# Patient Record
Sex: Female | Born: 1940 | Race: White | Hispanic: No | Marital: Married
Health system: Southern US, Community
[De-identification: ages and names within clinical notes are randomized; demographics above are authoritative.]

## PROBLEM LIST (undated history)

## (undated) DIAGNOSIS — G629 Polyneuropathy, unspecified: Secondary | ICD-10-CM

## (undated) DIAGNOSIS — E119 Type 2 diabetes mellitus without complications: Secondary | ICD-10-CM

## (undated) DIAGNOSIS — N289 Disorder of kidney and ureter, unspecified: Secondary | ICD-10-CM

## (undated) DIAGNOSIS — J939 Pneumothorax, unspecified: Secondary | ICD-10-CM

## (undated) HISTORY — PX: AORTA SURGERY: SHX548

## (undated) HISTORY — PX: ABDOMINAL HYSTERECTOMY: SHX81

## (undated) HISTORY — PX: CHOLECYSTECTOMY: SHX55

## (undated) HISTORY — PX: HIP OPEN REDUCTION: SHX1755

---

## 2021-01-18 ENCOUNTER — Emergency Department (HOSPITAL_BASED_OUTPATIENT_CLINIC_OR_DEPARTMENT_OTHER)
Admission: EM | Admit: 2021-01-18 | Discharge: 2021-01-19 | Disposition: A | Discharge status: Home / Self Care | Payer: Medicare Other | Attending: Emergency Medicine | Admitting: Emergency Medicine

## 2021-01-18 ENCOUNTER — Other Ambulatory Visit: Payer: Self-pay

## 2021-01-18 ENCOUNTER — Emergency Department (HOSPITAL_BASED_OUTPATIENT_CLINIC_OR_DEPARTMENT_OTHER): Payer: Medicare Other

## 2021-01-18 DIAGNOSIS — R1084 Generalized abdominal pain: Secondary | ICD-10-CM | POA: Insufficient documentation

## 2021-01-18 DIAGNOSIS — R300 Dysuria: Secondary | ICD-10-CM | POA: Diagnosis present

## 2021-01-18 DIAGNOSIS — I77811 Abdominal aortic ectasia: Secondary | ICD-10-CM | POA: Diagnosis not present

## 2021-01-18 DIAGNOSIS — I700 Atherosclerosis of aorta: Secondary | ICD-10-CM

## 2021-01-18 LAB — COMPREHENSIVE METABOLIC PANEL
ALT: 20 U/L (ref 0–44)
AST: 30 U/L (ref 15–41)
Albumin: 3.6 g/dL (ref 3.5–5.0)
Alkaline Phosphatase: 69 U/L (ref 38–126)
Anion gap: 12 (ref 5–15)
BUN: 30 mg/dL — ABNORMAL HIGH (ref 8–23)
CO2: 24 mmol/L (ref 22–32)
Calcium: 9 mg/dL (ref 8.9–10.3)
Chloride: 92 mmol/L — ABNORMAL LOW (ref 98–111)
Creatinine, Ser: 1.43 mg/dL — ABNORMAL HIGH (ref 0.44–1.00)
GFR, Estimated: 37 mL/min — ABNORMAL LOW (ref >60)
Glucose, Bld: 245 mg/dL — ABNORMAL HIGH (ref 70–99)
Potassium: 3.9 mmol/L (ref 3.5–5.1)
Sodium: 128 mmol/L — ABNORMAL LOW (ref 135–145)
Total Bilirubin: 0.6 mg/dL (ref 0.3–1.2)
Total Protein: 7.4 g/dL (ref 6.5–8.1)

## 2021-01-18 LAB — URINALYSIS, MICROSCOPIC (REFLEX)

## 2021-01-18 LAB — CBC WITH DIFFERENTIAL/PLATELET
Abs Immature Granulocytes: 0.07 10*3/uL (ref 0.00–0.07)
Basophils Absolute: 0 10*3/uL (ref 0.0–0.1)
Basophils Relative: 0 %
Eosinophils Absolute: 0 10*3/uL (ref 0.0–0.5)
Eosinophils Relative: 0 %
HCT: 34.9 % — ABNORMAL LOW (ref 36.0–46.0)
Hemoglobin: 11.6 g/dL — ABNORMAL LOW (ref 12.0–15.0)
Immature Granulocytes: 0 %
Lymphocytes Relative: 9 %
Lymphs Abs: 1.5 10*3/uL (ref 0.7–4.0)
MCH: 30.5 pg (ref 26.0–34.0)
MCHC: 33.2 g/dL (ref 30.0–36.0)
MCV: 91.8 fL (ref 80.0–100.0)
Monocytes Absolute: 0.5 10*3/uL (ref 0.1–1.0)
Monocytes Relative: 3 %
Neutro Abs: 14.4 10*3/uL — ABNORMAL HIGH (ref 1.7–7.7)
Neutrophils Relative %: 88 %
Platelets: 336 10*3/uL (ref 150–400)
RBC: 3.8 MIL/uL — ABNORMAL LOW (ref 3.87–5.11)
RDW: 14.6 % (ref 11.5–15.5)
WBC: 16.5 10*3/uL — ABNORMAL HIGH (ref 4.0–10.5)
nRBC: 0 % (ref 0.0–0.2)

## 2021-01-18 LAB — URINALYSIS, ROUTINE W REFLEX MICROSCOPIC
Bilirubin Urine: NEGATIVE
Glucose, UA: 100 mg/dL — AB
Hgb urine dipstick: NEGATIVE
Ketones, ur: NEGATIVE mg/dL
Leukocytes,Ua: NEGATIVE
Nitrite: NEGATIVE
Protein, ur: 100 mg/dL — AB
Specific Gravity, Urine: 1.02 (ref 1.005–1.030)
pH: 5.5 (ref 5.0–8.0)

## 2021-01-18 LAB — LIPASE, BLOOD: Lipase: 22 U/L (ref 11–51)

## 2021-01-18 MED ORDER — FENTANYL CITRATE (PF) 100 MCG/2ML IJ SOLN
25.0000 ug | Freq: Once | INTRAMUSCULAR | Status: AC
  Administered 2021-01-18: 25 ug via INTRAVENOUS
  Filled 2021-01-18: qty 2

## 2021-01-18 MED ORDER — SODIUM CHLORIDE 0.9 % IV BOLUS
500.0000 mL | Freq: Once | INTRAVENOUS | Status: AC
  Administered 2021-01-18: 500 mL via INTRAVENOUS

## 2021-01-18 MED ORDER — IOHEXOL 350 MG/ML SOLN
80.0000 mL | Freq: Once | INTRAVENOUS | Status: AC | PRN
  Administered 2021-01-18: 80 mL via INTRAVENOUS

## 2021-01-18 NOTE — ED Notes (Signed)
Patient transported to CT 

## 2021-01-18 NOTE — ED Provider Notes (Signed)
[facility] EMERGENCY DEPARTMENT Provider Note   CSN: [id number] Arrival date & time: 01/18/21  1946     History Chief Complaint  Patient presents with   Dysuria   Abdominal Pain    Tamara Craig is a 80 y.o. female.  The history is provided by the patient, a relative and medical records.  Dysuria Associated symptoms: abdominal pain   Abdominal Pain Associated symptoms: dysuria   Tamara Craig is a 80 y.o. female who presents to the Emergency Department complaining of possible UTI.  She presents to the ED accompanied by her daughter for evaluation for possible UTI.  She was treated for a UTI one month ago and was treated with a round of amoxicillin (for abdominal pain - possible diverticulitis as well as UTI).  She completed the antibiotics around 6/2 and was rechecked and found to be free of the UTI.  She has been complaining of lower abdominal pain off and on.  Pain is worse with meals over the last thirty days.  No dysuria.  No fever, vomiting.  Occasional nausea.  Has diarrhea - 2-3 times daily.      No past medical history on file. DM, HTN, HPL  There are no problems to display for this patient.      OB History   No obstetric history on file.     No family history on file.     Home Medications Prior to Admission medications   Not on File    Allergies    Morphine and related  Review of Systems   Review of Systems  Gastrointestinal:  Positive for abdominal pain.  Genitourinary:  Positive for dysuria.  All other systems reviewed and are negative.  Physical Exam Updated Vital Signs BP (!) 163/117   Pulse 100   Temp 98 F (36.7 C) (Oral)   Resp 16   Ht 5\' 2"  (1.575 m)   Wt 65.8 kg   SpO2 96%   BMI 26.52 kg/m   Physical Exam Vitals and nursing note reviewed.  Constitutional:      Appearance: She is well-developed.     Comments: Uncomfortable appearing  HENT:     Head: Normocephalic and atraumatic.  Cardiovascular:     Rate and  Rhythm: Normal rate and regular rhythm.     Heart sounds: No murmur heard. Pulmonary:     Effort: Pulmonary effort is normal. No respiratory distress.     Breath sounds: Normal breath sounds.  Abdominal:     Palpations: Abdomen is soft.     Tenderness: There is no guarding or rebound.     Comments: Mild generalized abdominal tenderness  Musculoskeletal:        General: No tenderness.  Skin:    General: Skin is warm and dry.  Neurological:     Mental Status: She is alert and oriented to person, place, and time.  Psychiatric:        Behavior: Behavior normal.    ED Results / Procedures / Treatments   Labs (all labs ordered are listed, but only abnormal results are displayed) Labs Reviewed  URINALYSIS, ROUTINE W REFLEX MICROSCOPIC - Abnormal; Notable for the following components:      Result Value   APPearance HAZY (*)    Glucose, UA 100 (*)    Protein, ur 100 (*)    All other components within normal limits  COMPREHENSIVE METABOLIC PANEL - Abnormal; Notable for the following components:   Sodium 128 (*)    Chloride 92 (*)  Glucose, Bld 245 (*)    BUN 30 (*)    Creatinine, Ser 1.43 (*)    GFR, Estimated 37 (*)    All other components within normal limits  CBC WITH DIFFERENTIAL/PLATELET - Abnormal; Notable for the following components:   WBC 16.5 (*)    RBC 3.80 (*)    Hemoglobin 11.6 (*)    HCT 34.9 (*)    Neutro Abs 14.4 (*)    All other components within normal limits  URINALYSIS, MICROSCOPIC (REFLEX) - Abnormal; Notable for the following components:   Bacteria, UA FEW (*)    All other components within normal limits  URINE CULTURE  LIPASE, BLOOD    EKG None  Radiology CT Abdomen Pelvis Wo Contrast  Result Date: 01/18/2021 CLINICAL DATA:  Unspecified abdominal pain EXAM: CT ABDOMEN AND PELVIS WITHOUT CONTRAST TECHNIQUE: Multidetector CT imaging of the abdomen and pelvis was performed following the standard protocol without IV contrast. COMPARISON:  12/15/2020  FINDINGS: Lower chest: Mild bibasilar pulmonary fibrosis. Multiple healed right rib fractures are noted. Extensive multi-vessel coronary artery calcification. Global cardiac size within normal limits. Hepatobiliary: No focal liver abnormality is seen. Status post cholecystectomy. No biliary dilatation. Pancreas: Unremarkable Spleen: Unremarkable Adrenals/Urinary Tract: The adrenal glands are unremarkable. The kidneys are normal in size and position. Mild right renal cortical scarring noted. Tiny hyperdense focus within the upper pole of the left kidney possibly representing a tiny cortical calcification or tiny hyperdense cyst is unchanged. No hydronephrosis. No intrarenal or ureteral calculi. No perinephric fluid collections are seen. The bladder is unremarkable. Stomach/Bowel: The stomach, small bowel, and large bowel are unremarkable save for a few scattered diverticula within the sigmoid colon. Appendix is normal. No free intraperitoneal gas or fluid. Vascular/Lymphatic: Extensive aortoiliac atherosclerotic calcification is again identified with particularly prominent atherosclerotic calcification seen within the proximal superior mesenteric artery and inferior mesenteric artery as well as the a proximal renal arteries bilaterally. Moderate atherosclerotic calcification is also noted within the proximal celiac axis. No aortic aneurysm. No pathologic adenopathy within the abdomen and pelvis. Reproductive: Status post hysterectomy. No adnexal masses. Other: No abdominal wall hernia.  Rectum unremarkable. Musculoskeletal: Healed fractures of the right superior and inferior pubic rami are identified. Degenerative changes are seen within the lumbar spine. No acute bone abnormality. IMPRESSION: No acute intra-abdominal pathology identified. No definite radiographic explanation for the patient's reported symptoms. Peripheral vascular disease with extensive atherosclerotic calcification at the origin of the mesenteric  and renal vasculature. If there is clinical evidence of chronic mesenteric ischemia or hemodynamically significant renal artery stenosis, this would be better assessed with CT or formal arteriography. Electronically Signed   By: Helyn Numbers MD   On: 01/18/2021 22:13   CT Angio Abd/Pel W and/or Wo Contrast  Result Date: 01/18/2021 CLINICAL DATA:  Mesenteric ischemia EXAM: CTA ABDOMEN AND PELVIS WITHOUT AND WITH CONTRAST TECHNIQUE: Multidetector CT imaging of the abdomen and pelvis was performed using the standard protocol during bolus administration of intravenous contrast. Multiplanar reconstructed images and MIPs were obtained and reviewed to evaluate the vascular anatomy. CONTRAST:  48mL OMNIPAQUE IOHEXOL 350 MG/ML SOLN COMPARISON:  Noncontrast CT earlier today FINDINGS: VASCULAR Aorta: Heavily calcified aorta.  No aneurysm.  No dissection. Celiac: Calcifications at the origin and within the proximal celiac artery. No significant stenosis. SMA: Heavily calcified origin and proximal vessel. Tight stenosis within the proximal SMA. Renals: Heavily calcified renal arteries proximally, left greater than right. Probable moderate stenosis within the proximal left renal artery. IMA: Heavily  calcified origin. Vessel is patent. Probable stenosis at the origin. Inflow: Heavily calcified common iliac arteries. No significant stenosis. Proximal Outflow: Calcified common femoral arteries bilaterally. No significant stenosis. Veins: No obvious venous abnormality within the limitations of this arterial phase study. Review of the MIP images confirms the above findings. NON-VASCULAR Lower chest: Scarring in the lung bases.  No acute abnormality. Hepatobiliary: No focal liver abnormality is seen. Status post cholecystectomy. No biliary dilatation. Pancreas: No focal abnormality or ductal dilatation. Spleen: No focal abnormality.  Normal size. Adrenals/Urinary Tract: No focal renal or adrenal mass. Areas of scarring and  cortical thinning in the upper pole of the right kidney. No hydronephrosis. Urinary bladder unremarkable. Stomach/Bowel: No areas of wall thickening to suggest ischemia or enteritis/colitis. No bowel obstruction. Grossly unremarkable. Lymphatic: No adenopathy Reproductive: Prior hysterectomy.  No adnexal masses. Other: No free fluid or free air. Musculoskeletal: Old healed right superior and inferior pubic rami fractures. Degenerative changes in the lumbar spine. No acute bony abnormality. IMPRESSION: VASCULAR Heavily calcified aorta, iliac vessels and branch vessels. Tight stenosis within the proximal left superior mesenteric artery. Probable mild-to-moderate stenosis within the proximal left renal artery. Mild narrowing at the origin of the IMA. NON-VASCULAR No visible changes within the bowel to suggest ischemia. Electronically Signed   By: Charlett Nose M.D.   On: 01/18/2021 22:59    Procedures Procedures   Medications Ordered in ED Medications  sodium chloride 0.9 % bolus 500 mL (0 mLs Intravenous Stopped 01/18/21 2208)  fentaNYL (SUBLIMAZE) injection 25 mcg (25 mcg Intravenous Given 01/18/21 2048)  sodium chloride 0.9 % bolus 500 mL (500 mLs Intravenous New Bag/Given 01/18/21 2209)  iohexol (OMNIPAQUE) 350 MG/ML injection 80 mL (80 mLs Intravenous Contrast Given 01/18/21 2232)    ED Course  I have reviewed the triage vital signs and the nursing notes.  Pertinent labs & imaging results that were available during my care of the patient were reviewed by me and considered in my medical decision making (see chart for details).    MDM Rules/Calculators/A&P                         patient here for evaluation of intermittent abdominal pain over the last month, complaining of dysuria. She has mild tenderness on examination without peritoneal findings. CBC with leukocytosis, records reviewed in care everywhere. Leukocytosis is similar but slightly higher when compared to prior. BMP with creatinine at her  baseline, mild decrease in sodium compared to priors. She was treated with IV fluid hydration. Presentation is not consistent with DKA. CT scan is negative for acute ischemia but does demonstrate stenosis of the abdominal vasculature. Discussed with daughter importance of outpatient follow-up as well as return precautions.  Final Clinical Impression(s) / ED Diagnoses Final diagnoses:  Generalized abdominal pain  Atherosclerosis of abdominal aorta [facility])    Rx / DC Orders ED Discharge Orders     None        Tilden Fossa, MD 01/19/21 [id number]

## 2021-01-18 NOTE — ED Triage Notes (Signed)
Pt c/o "UTI"-daughter states pt c/o abd pain x today-states pt completed abx for UTI ~2 weeks ago-pt grunting in triage-to triage in w/c

## 2021-01-19 DIAGNOSIS — I77811 Abdominal aortic ectasia: Secondary | ICD-10-CM | POA: Diagnosis not present

## 2021-01-20 LAB — URINE CULTURE

## 2021-04-28 ENCOUNTER — Emergency Department (HOSPITAL_BASED_OUTPATIENT_CLINIC_OR_DEPARTMENT_OTHER): Payer: Medicare Other

## 2021-04-28 ENCOUNTER — Emergency Department (HOSPITAL_BASED_OUTPATIENT_CLINIC_OR_DEPARTMENT_OTHER)
Admission: EM | Admit: 2021-04-28 | Discharge: 2021-04-28 | Disposition: A | Discharge status: Home / Self Care | Payer: Medicare Other | Attending: Emergency Medicine | Admitting: Emergency Medicine

## 2021-04-28 ENCOUNTER — Encounter (HOSPITAL_BASED_OUTPATIENT_CLINIC_OR_DEPARTMENT_OTHER): Payer: Self-pay | Admitting: *Deleted

## 2021-04-28 ENCOUNTER — Other Ambulatory Visit: Payer: Self-pay

## 2021-04-28 DIAGNOSIS — R410 Disorientation, unspecified: Secondary | ICD-10-CM | POA: Insufficient documentation

## 2021-04-28 DIAGNOSIS — E1165 Type 2 diabetes mellitus with hyperglycemia: Secondary | ICD-10-CM | POA: Insufficient documentation

## 2021-04-28 DIAGNOSIS — R739 Hyperglycemia, unspecified: Secondary | ICD-10-CM | POA: Diagnosis present

## 2021-04-28 HISTORY — DX: Type 2 diabetes mellitus without complications: E11.9

## 2021-04-28 HISTORY — DX: Polyneuropathy, unspecified: G62.9

## 2021-04-28 HISTORY — DX: Disorder of kidney and ureter, unspecified: N28.9

## 2021-04-28 HISTORY — DX: Pneumothorax, unspecified: J93.9

## 2021-04-28 LAB — CBG MONITORING, ED: Glucose-Capillary: 248 mg/dL — ABNORMAL HIGH (ref 70–99)

## 2021-04-28 LAB — CBC WITH DIFFERENTIAL/PLATELET
Abs Immature Granulocytes: 0.06 10*3/uL (ref 0.00–0.07)
Basophils Absolute: 0 10*3/uL (ref 0.0–0.1)
Basophils Relative: 0 %
Eosinophils Absolute: 0 10*3/uL (ref 0.0–0.5)
Eosinophils Relative: 0 %
HCT: 33.5 % — ABNORMAL LOW (ref 36.0–46.0)
Hemoglobin: 11 g/dL — ABNORMAL LOW (ref 12.0–15.0)
Immature Granulocytes: 1 %
Lymphocytes Relative: 14 %
Lymphs Abs: 1.6 10*3/uL (ref 0.7–4.0)
MCH: 31.3 pg (ref 26.0–34.0)
MCHC: 32.8 g/dL (ref 30.0–36.0)
MCV: 95.4 fL (ref 80.0–100.0)
Monocytes Absolute: 0.6 10*3/uL (ref 0.1–1.0)
Monocytes Relative: 6 %
Neutro Abs: 9.2 10*3/uL — ABNORMAL HIGH (ref 1.7–7.7)
Neutrophils Relative %: 79 %
Platelets: 386 10*3/uL (ref 150–400)
RBC: 3.51 MIL/uL — ABNORMAL LOW (ref 3.87–5.11)
RDW: 14.1 % (ref 11.5–15.5)
WBC: 11.5 10*3/uL — ABNORMAL HIGH (ref 4.0–10.5)
nRBC: 0 % (ref 0.0–0.2)

## 2021-04-28 LAB — COMPREHENSIVE METABOLIC PANEL
ALT: 16 U/L (ref 0–44)
AST: 26 U/L (ref 15–41)
Albumin: 3.4 g/dL — ABNORMAL LOW (ref 3.5–5.0)
Alkaline Phosphatase: 101 U/L (ref 38–126)
Anion gap: 10 (ref 5–15)
BUN: 24 mg/dL — ABNORMAL HIGH (ref 8–23)
CO2: 27 mmol/L (ref 22–32)
Calcium: 8.8 mg/dL — ABNORMAL LOW (ref 8.9–10.3)
Chloride: 93 mmol/L — ABNORMAL LOW (ref 98–111)
Creatinine, Ser: 1.51 mg/dL — ABNORMAL HIGH (ref 0.44–1.00)
GFR, Estimated: 35 mL/min — ABNORMAL LOW (ref >60)
Glucose, Bld: 354 mg/dL — ABNORMAL HIGH (ref 70–99)
Potassium: 4.4 mmol/L (ref 3.5–5.1)
Sodium: 130 mmol/L — ABNORMAL LOW (ref 135–145)
Total Bilirubin: 0.4 mg/dL (ref 0.3–1.2)
Total Protein: 7.4 g/dL (ref 6.5–8.1)

## 2021-04-28 LAB — TROPONIN I (HIGH SENSITIVITY)
Troponin I (High Sensitivity): 32 ng/L — ABNORMAL HIGH (ref <18)
Troponin I (High Sensitivity): 32 ng/L — ABNORMAL HIGH (ref <18)

## 2021-04-28 MED ORDER — LACTATED RINGERS IV BOLUS
1000.0000 mL | Freq: Once | INTRAVENOUS | Status: AC
  Administered 2021-04-28: 1000 mL via INTRAVENOUS

## 2021-04-28 NOTE — ED Triage Notes (Signed)
Brought in by daughter for abnormal labs increased trop. 51, increased CBG and cont UTI

## 2021-04-28 NOTE — ED Provider Notes (Signed)
[facility] EMERGENCY DEPARTMENT Provider Note   CSN: [id number] Arrival date & time: 04/28/21  1823     History Chief Complaint  Patient presents with   abnormal labs    Tamara Craig is a 80 y.o. female.  Patient is an 80 year old female with a history of memory issues, diabetes, kidney disease who is presenting today with her daughter due to persistent confusion and elevated blood sugar.  Daughter reports that 2 weeks ago she was seen at urgent care and at that time she was having confusion and hyperglycemia.  They initially did a work-up that showed hyperglycemia but her urine later came back positive for E. coli that was pansensitive and she was started on Vantin 100 mg daily.  She took a 10-day course of that and finished it this past Monday.  However daughter reports that she did not really get any better throughout the course of her symptoms.  Her doctor has been continuing to adjust her Lantus because he had taken her off her insulin because she was refusing to eat and complaining of stomach pain.  She has started eating and drinking more but her blood sugar even in the morning has remained in the 2 and 300s.  She has not complained of any cough, chest pain, shortness of breath.  She has not had fever, dysuria or diarrhea.  She has had no nausea or vomiting.  They spoke with their doctor today and she was seen at urgent care 2 days ago due to ongoing symptoms.  At that time she had a chest x-ray which was negative, labs that showed hyperglycemia and a troponin of 51 without old to compare.  She was then started on Augmentin due to concern for persistent UTI.  When daughter called her doctor today and reported ongoing elevated blood sugar he wanted her to come and be evaluated the emergency room before any further care was modified.  The history is provided by the patient, a relative and medical records.      Past Medical History:  Diagnosis Date   DM (diabetes mellitus) (HCC)     Kidney disease    Neuropathy    Pneumothorax     There are no problems to display for this patient.   Past Surgical History:  Procedure Laterality Date   ABDOMINAL HYSTERECTOMY     AORTA SURGERY     CHOLECYSTECTOMY     HIP OPEN REDUCTION       OB History   No obstetric history on file.     No family history on file.  Social History   Substance Use Topics   Alcohol use: Not Currently   Drug use: Not Currently    Home Medications Prior to Admission medications   Not on File    Allergies    Gabapentin, Morphine and related, and Oxybutynin  Review of Systems   Review of Systems  All other systems reviewed and are negative.  Physical Exam Updated Vital Signs BP (!) 150/74   Pulse 89   Resp 19   Ht 5\' 2"  (1.575 m)   Wt 56.7 kg   SpO2 94%   BMI 22.86 kg/m   Physical Exam Vitals and nursing note reviewed.  Constitutional:      General: She is not in acute distress.    Appearance: Normal appearance. She is well-developed.  HENT:     Head: Normocephalic and atraumatic.  Eyes:     General: No visual field deficit.  Pupils: Pupils are equal, round, and reactive to light.  Cardiovascular:     Rate and Rhythm: Normal rate and regular rhythm.     Pulses: Normal pulses.     Heart sounds: Normal heart sounds. No murmur heard.   No friction rub.  Pulmonary:     Effort: Pulmonary effort is normal.     Breath sounds: Normal breath sounds. No wheezing or rales.  Chest:     Chest wall: No tenderness.  Abdominal:     General: Bowel sounds are normal. There is no distension.     Palpations: Abdomen is soft.     Tenderness: There is no abdominal tenderness. There is no guarding or rebound.  Musculoskeletal:        General: No tenderness. Normal range of motion.     Cervical back: Normal range of motion and neck supple.     Right lower leg: No edema.     Left lower leg: No edema.     Comments: No edema  Skin:    General: Skin is warm and dry.      Findings: No rash.  Neurological:     Mental Status: She is alert and oriented to person, place, and time.     Cranial Nerves: No cranial nerve deficit or facial asymmetry.     Sensory: Sensation is intact. No sensory deficit.     Motor: Motor function is intact. No weakness or pronator drift.     Coordination: Coordination is intact.     Gait: Gait is intact. Gait normal.     Comments: Intermittent word finding difficulty but when she slows down she is able to say the words.  She can follow commands without any difficulty.  Oriented to person and place but not to date.  Psychiatric:        Behavior: Behavior normal.    ED Results / Procedures / Treatments   Labs (all labs ordered are listed, but only abnormal results are displayed) Labs Reviewed  CBC WITH DIFFERENTIAL/PLATELET - Abnormal; Notable for the following components:      Result Value   WBC 11.5 (*)    RBC 3.51 (*)    Hemoglobin 11.0 (*)    HCT 33.5 (*)    Neutro Abs 9.2 (*)    All other components within normal limits  COMPREHENSIVE METABOLIC PANEL - Abnormal; Notable for the following components:   Sodium 130 (*)    Chloride 93 (*)    Glucose, Bld 354 (*)    BUN 24 (*)    Creatinine, Ser 1.51 (*)    Calcium 8.8 (*)    Albumin 3.4 (*)    GFR, Estimated 35 (*)    All other components within normal limits  CBG MONITORING, ED - Abnormal; Notable for the following components:   Glucose-Capillary 248 (*)    All other components within normal limits  TROPONIN I (HIGH SENSITIVITY) - Abnormal; Notable for the following components:   Troponin I (High Sensitivity) 32 (*)    All other components within normal limits  TROPONIN I (HIGH SENSITIVITY) - Abnormal; Notable for the following components:   Troponin I (High Sensitivity) 32 (*)    All other components within normal limits    EKG EKG Interpretation  Date/Time:  Friday April 28 2021 18:51:48 EDT Ventricular Rate:  88 PR Interval:  164 QRS Duration: 118 QT  Interval:  384 QTC Calculation: 465 R Axis:   -63 Text Interpretation: Ectopic atrial rhythm Incomplete RBBB and  LAFB Left ventricular hypertrophy Anterior Q waves, possibly due to LVH ST elevation, consider inferior injury No previous tracing Confirmed by Gwyneth Sprout ([id number]) on 04/28/2021 9:02:18 PM  Radiology CT Head Wo Contrast  Result Date: 04/28/2021 CLINICAL DATA:  Altered level of consciousness, urinary tract infection EXAM: CT HEAD WITHOUT CONTRAST TECHNIQUE: Contiguous axial images were obtained from the base of the skull through the vertex without intravenous contrast. COMPARISON:  None. FINDINGS: Brain: Confluent hypodensities throughout the periventricular white matter consistent with chronic small vessel ischemic change. 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 No signs of acute infarct or hemorrhage. Lateral ventricles and midline structures are unremarkable. No acute extra-axial fluid collections. No mass effect. Diffuse cerebral atrophy. Vascular: Extensive atherosclerosis.  No hyperdense vessel. Skull: Normal. Negative for fracture or focal lesion. Sinuses/Orbits: No acute finding. Other: None. IMPRESSION: 1. Chronic ischemic changes as above. No acute intracranial process. Electronically Signed   By: Sharlet Salina M.D.   On: 04/28/2021 20:44    Procedures Procedures   Medications Ordered in ED Medications  lactated ringers bolus 1,000 mL (0 mLs Intravenous Stopped 04/28/21 2230)    ED Course  I have reviewed the triage vital signs and the nursing notes.  Pertinent labs & imaging results that were available during my care of the patient were reviewed by me and considered in my medical decision making (see chart for details).    MDM Rules/Calculators/A&P                           Patient is an elderly female with multiple medical problems presenting today with ongoing hyperglycemia and intermittent confusion.  This has been ongoing  for the last 2 weeks.  She did have changes in her insulin which is most likely the cause of her hyperglycemia.  However in addition she had a culture positive E. coli UTI and had taken 10 days of Vantin but because symptoms were not improved she was started on Augmentin 2 days ago.  She has not had any nausea or vomiting or reason for dehydration.  She continues to have elevated blood sugars in the 300s here with no change in her baseline renal function.  Her white blood cell count is improving is now 11 from 13.  She had a troponin done of uncertain significance when she was seen at urgent care 2 days ago.  Her EKG does show changes of LVH and incomplete right bundle branch block but based on read from outside hospital seems to be similar.  Troponin x2 today is 32 which is lower than 51 2 days ago and patient denies any chest pain or shortness of breath consistent with an acute cardiac cause. Head CT negative for evidence of stroke as patient's symptoms have been present for 2 weeks feel that there would be an abnormality on CT.  Most likely mild delirium from antibiotics and hyperglycemia.  Patient appears stable for discharge home.  Needs to continue to follow-up with her PCP for blood sugar control.  After 1 L bolus sugar is better in the 200s.  She will finish her dose of antibiotics.  She was given return precautions.  MDM   Amount and/or Complexity of Data Reviewed Clinical lab tests: ordered and reviewed Tests in the radiology section of CPT: ordered and reviewed Tests in the medicine section of CPT: ordered and reviewed Independent visualization of images, tracings, or specimens: yes    Final Clinical Impression(s) /  ED Diagnoses Final diagnoses:  Hyperglycemia    Rx / DC Orders ED Discharge Orders     None        Gwyneth Sprout, MD 04/28/21 2259

## 2021-04-28 NOTE — Discharge Instructions (Signed)
Continue to follow diabetic diet and drink plenty of liquids.  Finish the antibiotic.  Everything with your heart looks okay today.  Your kidneys are functioning at the same as usual.  If you start having high fever, vomiting, inability to walk or other concerns please return to the emergency room.

## 2021-07-15 ENCOUNTER — Encounter (HOSPITAL_BASED_OUTPATIENT_CLINIC_OR_DEPARTMENT_OTHER): Payer: Self-pay | Admitting: *Deleted

## 2021-07-15 ENCOUNTER — Emergency Department (HOSPITAL_BASED_OUTPATIENT_CLINIC_OR_DEPARTMENT_OTHER): Payer: Medicare Other

## 2021-07-15 ENCOUNTER — Other Ambulatory Visit: Payer: Self-pay

## 2021-07-15 ENCOUNTER — Emergency Department (HOSPITAL_BASED_OUTPATIENT_CLINIC_OR_DEPARTMENT_OTHER)
Admission: EM | Admit: 2021-07-15 | Discharge: 2021-07-16 | Disposition: A | Discharge status: Home / Self Care | Payer: Medicare Other | Attending: Emergency Medicine | Admitting: Emergency Medicine

## 2021-07-15 DIAGNOSIS — M25512 Pain in left shoulder: Secondary | ICD-10-CM | POA: Diagnosis not present

## 2021-07-15 DIAGNOSIS — E1140 Type 2 diabetes mellitus with diabetic neuropathy, unspecified: Secondary | ICD-10-CM | POA: Insufficient documentation

## 2021-07-15 DIAGNOSIS — S199XXA Unspecified injury of neck, initial encounter: Secondary | ICD-10-CM | POA: Insufficient documentation

## 2021-07-15 DIAGNOSIS — R8279 Other abnormal findings on microbiological examination of urine: Secondary | ICD-10-CM | POA: Insufficient documentation

## 2021-07-15 DIAGNOSIS — F02818 Dementia in other diseases classified elsewhere, unspecified severity, with other behavioral disturbance: Secondary | ICD-10-CM | POA: Insufficient documentation

## 2021-07-15 DIAGNOSIS — D72829 Elevated white blood cell count, unspecified: Secondary | ICD-10-CM | POA: Insufficient documentation

## 2021-07-15 DIAGNOSIS — U071 COVID-19: Secondary | ICD-10-CM | POA: Insufficient documentation

## 2021-07-15 DIAGNOSIS — G309 Alzheimer's disease, unspecified: Secondary | ICD-10-CM | POA: Insufficient documentation

## 2021-07-15 DIAGNOSIS — W19XXXA Unspecified fall, initial encounter: Secondary | ICD-10-CM | POA: Insufficient documentation

## 2021-07-15 DIAGNOSIS — N390 Urinary tract infection, site not specified: Secondary | ICD-10-CM

## 2021-07-15 DIAGNOSIS — S0990XA Unspecified injury of head, initial encounter: Secondary | ICD-10-CM | POA: Insufficient documentation

## 2021-07-15 DIAGNOSIS — R109 Unspecified abdominal pain: Secondary | ICD-10-CM | POA: Diagnosis not present

## 2021-07-15 LAB — RESP PANEL BY RT-PCR (FLU A&B, COVID) ARPGX2
Influenza A by PCR: NEGATIVE
Influenza B by PCR: NEGATIVE
SARS Coronavirus 2 by RT PCR: POSITIVE — AB

## 2021-07-15 LAB — BASIC METABOLIC PANEL
Anion gap: 11 (ref 5–15)
BUN: 49 mg/dL — ABNORMAL HIGH (ref 8–23)
CO2: 20 mmol/L — ABNORMAL LOW (ref 22–32)
Calcium: 8.8 mg/dL — ABNORMAL LOW (ref 8.9–10.3)
Chloride: 100 mmol/L (ref 98–111)
Creatinine, Ser: 2.16 mg/dL — ABNORMAL HIGH (ref 0.44–1.00)
GFR, Estimated: 22 mL/min — ABNORMAL LOW (ref >60)
Glucose, Bld: 134 mg/dL — ABNORMAL HIGH (ref 70–99)
Potassium: 4.1 mmol/L (ref 3.5–5.1)
Sodium: 131 mmol/L — ABNORMAL LOW (ref 135–145)

## 2021-07-15 LAB — CBC WITH DIFFERENTIAL/PLATELET
Abs Immature Granulocytes: 0.11 10*3/uL — ABNORMAL HIGH (ref 0.00–0.07)
Basophils Absolute: 0 10*3/uL (ref 0.0–0.1)
Basophils Relative: 0 %
Eosinophils Absolute: 0 10*3/uL (ref 0.0–0.5)
Eosinophils Relative: 0 %
HCT: 27.8 % — ABNORMAL LOW (ref 36.0–46.0)
Hemoglobin: 9.2 g/dL — ABNORMAL LOW (ref 12.0–15.0)
Immature Granulocytes: 1 %
Lymphocytes Relative: 14 %
Lymphs Abs: 2 10*3/uL (ref 0.7–4.0)
MCH: 29.6 pg (ref 26.0–34.0)
MCHC: 33.1 g/dL (ref 30.0–36.0)
MCV: 89.4 fL (ref 80.0–100.0)
Monocytes Absolute: 0.8 10*3/uL (ref 0.1–1.0)
Monocytes Relative: 6 %
Neutro Abs: 10.9 10*3/uL — ABNORMAL HIGH (ref 1.7–7.7)
Neutrophils Relative %: 79 %
Platelets: 372 10*3/uL (ref 150–400)
RBC: 3.11 MIL/uL — ABNORMAL LOW (ref 3.87–5.11)
RDW: 15.1 % (ref 11.5–15.5)
WBC: 13.9 10*3/uL — ABNORMAL HIGH (ref 4.0–10.5)
nRBC: 0 % (ref 0.0–0.2)

## 2021-07-15 MED ORDER — FENTANYL CITRATE PF 50 MCG/ML IJ SOSY
12.5000 ug | PREFILLED_SYRINGE | Freq: Once | INTRAMUSCULAR | Status: AC
  Administered 2021-07-15: 20:00:00 12.5 ug via INTRAVENOUS
  Filled 2021-07-15: qty 1

## 2021-07-15 MED ORDER — SODIUM CHLORIDE 0.9 % IV BOLUS
500.0000 mL | Freq: Once | INTRAVENOUS | Status: AC
  Administered 2021-07-15: 23:00:00 500 mL via INTRAVENOUS

## 2021-07-15 MED ORDER — SODIUM CHLORIDE 0.9 % IV BOLUS: 1000.0000 mL | Freq: Once | INTRAVENOUS | Status: DC

## 2021-07-15 MED ORDER — SODIUM CHLORIDE 0.9 % IV BOLUS
500.0000 mL | Freq: Once | INTRAVENOUS | Status: AC
  Administered 2021-07-15: 20:00:00 500 mL via INTRAVENOUS

## 2021-07-15 MED ORDER — SODIUM CHLORIDE 0.9 % IV BOLUS
500.0000 mL | Freq: Once | INTRAVENOUS | Status: AC
  Administered 2021-07-16: 01:00:00 500 mL via INTRAVENOUS

## 2021-07-15 NOTE — ED Notes (Signed)
ED Provider at bedside. 

## 2021-07-15 NOTE — ED Notes (Signed)
Patient transported to CT/XR ?

## 2021-07-15 NOTE — ED Notes (Signed)
Placed purewick on patient. RN notified.

## 2021-07-15 NOTE — ED Notes (Signed)
Family member now with pt and ED Provider at bedside to eval

## 2021-07-15 NOTE — ED Provider Notes (Signed)
[facility] EMERGENCY DEPARTMENT Provider Note   CSN: [id number] Arrival date & time: 07/15/21  1746     History Chief Complaint  Patient presents with   Tamara Craig is a 80 y.o. female with a past medical history of diabetes, kidney disease and Alzheimer's dementia presenting today from her living facility after a fall.  Patient is unable to contribute to her history however facility reported that she had a fall in her room that was witnessed by her husband.  Husband states that he did not see her fall however patient told them that he was in the room as well.  Nobody is able to replace any specific concerns however they would like her "checked out.  Diagnosed with COVID on Tuesday and has supposedly been very weak ever since.   Past Medical History:  Diagnosis Date   DM (diabetes mellitus) (HCC)    Kidney disease    Neuropathy    Pneumothorax     There are no problems to display for this patient.   Past Surgical History:  Procedure Laterality Date   ABDOMINAL HYSTERECTOMY     AORTA SURGERY     CHOLECYSTECTOMY     HIP OPEN REDUCTION       OB History   No obstetric history on file.     No family history on file.  Social History   Substance Use Topics   Alcohol use: Not Currently   Drug use: Not Currently    Home Medications Prior to Admission medications   Not on File    Allergies    Gabapentin, Morphine and related, and Oxybutynin  Review of Systems   Review of Systems  Reason unable to perform ROS: Level 5 due to dementia.   Physical Exam Updated Vital Signs BP (!) 139/57 (BP Location: Right Arm)    Pulse 68    Temp 97.8 F (36.6 C) (Oral)    Resp 20    SpO2 93%   Physical Exam Vitals and nursing note reviewed.  Constitutional:      Appearance: Normal appearance.  HENT:     Head: Normocephalic and atraumatic.     Mouth/Throat:     Mouth: Mucous membranes are dry.     Pharynx: Oropharynx is clear.  Eyes:     General: No  scleral icterus.    Conjunctiva/sclera: Conjunctivae normal.  Cardiovascular:     Rate and Rhythm: Normal rate and regular rhythm.  Pulmonary:     Effort: Pulmonary effort is normal. No respiratory distress.  Abdominal:     General: Abdomen is flat.     Palpations: Abdomen is soft.     Tenderness: There is no abdominal tenderness.  Musculoskeletal:     Cervical back: Normal range of motion. No tenderness.  Skin:    Findings: No rash.  Neurological:     Mental Status: She is alert.  Psychiatric:        Mood and Affect: Mood normal.    ED Results / Procedures / Treatments   Labs (all labs ordered are listed, but only abnormal results are displayed) Labs Reviewed  RESP PANEL BY RT-PCR (FLU A&B, COVID) ARPGX2 - Abnormal; Notable for the following components:      Result Value   SARS Coronavirus 2 by RT PCR POSITIVE (*)    All other components within normal limits  BASIC METABOLIC PANEL - Abnormal; Notable for the following components:   Sodium 131 (*)    CO2 20 (*)  Glucose, Bld 134 (*)    BUN 49 (*)    Creatinine, Ser 2.16 (*)    Calcium 8.8 (*)    GFR, Estimated 22 (*)    All other components within normal limits  CBC WITH DIFFERENTIAL/PLATELET - Abnormal; Notable for the following components:   WBC 13.9 (*)    RBC 3.11 (*)    Hemoglobin 9.2 (*)    HCT 27.8 (*)    Neutro Abs 10.9 (*)    Abs Immature Granulocytes 0.11 (*)    All other components within normal limits  URINE CULTURE  URINALYSIS, ROUTINE W REFLEX MICROSCOPIC    EKG EKG Interpretation  Date/Time:  Saturday July 15 2021 18:55:20 EST Ventricular Rate:  74 PR Interval:  166 QRS Duration: 129 QT Interval:  404 QTC Calculation: 449 R Axis:   -54 Text Interpretation: Sinus rhythm Ventricular premature complex Right bundle branch block LVH with IVCD and secondary repol abnrm No significant change since last tracing Confirmed by Alvira Monday ([id number]) on 07/15/2021 7:36:17 PM  Radiology DG Chest  1 View  Result Date: 07/15/2021 CLINICAL DATA:  Shortness of breath.  Fall. EXAM: CHEST  1 VIEW COMPARISON:  06/10/2021 from [facility] FINDINGS: Irregularity about the lateral right chest wall consistent with rib fractures. These are likely chronic when correlated with the 06/21/2020 CT. Midline trachea. Cardiomegaly accentuated by AP portable technique. No pleural effusion or pneumothorax. Moderate right hemidiaphragm elevation is chronic. Interstitial coarsening which when correlated with prior CT is secondary to interstitial lung disease. No convincing evidence of acute superimposed lobar consolidation. IMPRESSION: Cardiomegaly and chronic interstitial coarsening. No convincing acute superimposed process. Right rib fractures, at least partially felt to be remote when correlated with prior CT. Correlate with point tenderness. No pleural fluid or pneumothorax. Electronically Signed   By: Jeronimo Greaves M.D.   On: 07/15/2021 18:55   CT Head Wo Contrast  Result Date: 07/15/2021 CLINICAL DATA:  Provided history: Head trauma, moderate/severe. EXAM: CT HEAD WITHOUT CONTRAST TECHNIQUE: Contiguous axial images were obtained from the base of the skull through the vertex without intravenous contrast. COMPARISON:  Prior head CT examinations 06/10/2021 and earlier. FINDINGS: Brain: Moderate generalized cerebral atrophy. Comparatively mild cerebellar atrophy. Redemonstrated small chronic cortical/subcortical infarct within the left occipital lobe (PCA vascular territory). Redemonstrated small chronic infarct within the left corona radiata/caudate body (series 2, image 17). Background moderate/advanced patchy and ill-defined hypoattenuation within the cerebral white matter, nonspecific but compatible with chronic small vessel ischemic disease. Small chronic infarct within the superior left cerebellar hemisphere. There is no acute intracranial hemorrhage. No acute demarcated cortical infarct is identified. No  extra-axial fluid collection. No evidence of an intracranial mass. No midline shift. Vascular: No hyperdense vessel.  Atherosclerotic calcifications. Skull: Normal. Negative for fracture or focal lesion. Sinuses/Orbits: Visualized orbits show no acute finding. Mild mucosal thickening within the left maxillary sinus at the imaged levels. Mild mucosal thickening and small volume frothy secretions within the bilateral sphenoid sinuses. Mild-to-moderate mucosal thickening and small volume frothy secretions within the bilateral ethmoid sinuses. Moderate partial opacification of the frontal sinuses, bilaterally. Other: Anterior subluxation of the mandibular condyles, bilaterally. IMPRESSION: No evidence of acute intracranial abnormality. Stable non-contrast CT appearance of the brain as compared to 06/10/2021. Parenchymal atrophy and chronic ischemic changes with multiple chronic infarcts, as described. Paranasal sinus disease at the imaged levels, as described. Anterior subluxation of the mandibular condyles, bilaterally. This may be due to patient positioning at the time of image acquisition. However, correlate  with physical exam findings to exclude TMJ dislocation. Electronically Signed   By: Jackey Loge D.O.   On: 07/15/2021 18:59   CT Cervical Spine Wo Contrast  Result Date: 07/15/2021 CLINICAL DATA:  Provided history: Neck trauma, dangerous injury mechanism. Additional history provided: Fall. EXAM: CT CERVICAL SPINE WITHOUT CONTRAST TECHNIQUE: Multidetector CT imaging of the cervical spine was performed without intravenous contrast. Multiplanar CT image reconstructions were also generated. COMPARISON:  CT of the cervical spine 06/10/2021. FINDINGS: Alignment: Straightening of the expected cervical lordosis. Trace C5-C6 grade 1 anterolisthesis. Skull base and vertebrae: The basion-dental and atlanto-dental intervals are maintained.No evidence of acute fracture to the cervical spine. Soft tissues and spinal  canal: No prevertebral fluid or swelling. No visible canal hematoma. Disc levels: Cervical spondylosis. No more than mild disc space narrowing. Multilevel shallow disc bulges and endplate spurring/uncovertebral hypertrophy. No appreciable high-grade spinal canal stenosis. No significant bony neural foraminal narrowing. Ventral osteophytes, most prominent at C5-C6 and C6-C7. Upper chest: Left apical pleuroparenchymal scarring, incompletely imaged. No consolidation within the imaged lung apices. No visible pneumothorax. IMPRESSION: No evidence of acute fracture to the cervical spine. Straightening of the expected cervical lordosis. Trace C5-C6 grade 1 anterolisthesis, unchanged from the prior CT of 06/10/2021. Cervical spondylosis, as described. Electronically Signed   By: Jackey Loge D.O.   On: 07/15/2021 19:05   CT Thoracic Spine Wo Contrast  Result Date: 07/15/2021 CLINICAL DATA:  Fall EXAM: CT THORACIC AND LUMBAR SPINE WITHOUT CONTRAST TECHNIQUE: Multidetector CT imaging of the thoracic and lumbar spine was performed without contrast. Multiplanar CT image reconstructions were also generated. COMPARISON:  CTA abdomen pelvis 01/18/2021 FINDINGS: CT THORACIC SPINE FINDINGS Alignment: Normal. Vertebrae: No acute fracture or focal pathologic process. Paraspinal and other soft tissues: There is calcific aortic atherosclerosis and incidentally noted aberrant right subclavian artery. There are fractures of the right third, fourth and seventh ribs. These are favored to be nonacute. Disc levels: There is no spinal canal stenosis. CT LUMBAR SPINE FINDINGS Segmentation: 5 lumbar type vertebrae. Alignment: Normal. Vertebrae: No acute fracture or focal pathologic process. Paraspinal and other soft tissues: Calcific aortic atherosclerosis. Disc levels: No spinal canal stenosis. IMPRESSION: 1. No acute fracture or static subluxation of the thoracic or lumbar spine. 2. Fractures of the right third, fourth and seventh ribs  are favored to be nonacute. Aortic Atherosclerosis (ICD10-I70.0). Electronically Signed   By: Deatra Robinson M.D.   On: 07/15/2021 20:41   CT Lumbar Spine Wo Contrast  Result Date: 07/15/2021 CLINICAL DATA:  Fall EXAM: CT THORACIC AND LUMBAR SPINE WITHOUT CONTRAST TECHNIQUE: Multidetector CT imaging of the thoracic and lumbar spine was performed without contrast. Multiplanar CT image reconstructions were also generated. COMPARISON:  CTA abdomen pelvis 01/18/2021 FINDINGS: CT THORACIC SPINE FINDINGS Alignment: Normal. Vertebrae: No acute fracture or focal pathologic process. Paraspinal and other soft tissues: There is calcific aortic atherosclerosis and incidentally noted aberrant right subclavian artery. There are fractures of the right third, fourth and seventh ribs. These are favored to be nonacute. Disc levels: There is no spinal canal stenosis. CT LUMBAR SPINE FINDINGS Segmentation: 5 lumbar type vertebrae. Alignment: Normal. Vertebrae: No acute fracture or focal pathologic process. Paraspinal and other soft tissues: Calcific aortic atherosclerosis. Disc levels: No spinal canal stenosis. IMPRESSION: 1. No acute fracture or static subluxation of the thoracic or lumbar spine. 2. Fractures of the right third, fourth and seventh ribs are favored to be nonacute. Aortic Atherosclerosis (ICD10-I70.0). Electronically Signed   By: Chrisandra Netters.D.  On: 07/15/2021 20:41   CT PELVIS WO CONTRAST  Result Date: 07/15/2021 CLINICAL DATA:  Fall EXAM: CT PELVIS WITHOUT CONTRAST TECHNIQUE: Multidetector CT imaging of the pelvis was performed following the standard protocol without intravenous contrast. COMPARISON:  CT 01/18/2021, 06/10/2021 FINDINGS: Urinary Tract: Slightly thick-walled appearance of the urinary bladder, greatest anteriorly. Bowel: Scattered diverticula in the colon. Mild radiopaque material within pelvic small bowel loops. Negative appendix. Vascular/Lymphatic: Advanced aortic atherosclerosis. No  aneurysm. No suspicious lymph nodes Reproductive:  Status post hysterectomy.  No adnexal mass Other:  Negative for pelvic effusion Musculoskeletal: Chronic fracture deformities of the right superior and inferior pubic rami. No definite acute osseous abnormality is seen. IMPRESSION: 1. No definite CT evidence for acute intrapelvic pathology. 2. Chronic right pubic rami fractures. 3. Slightly thick-walled appearance of urinary bladder, suggest correlation with urinalysis. Electronically Signed   By: Jasmine Pang M.D.   On: 07/15/2021 20:36   DG Shoulder Left  Result Date: 07/15/2021 CLINICAL DATA:  Fall and left shoulder pain. EXAM: LEFT SHOULDER - 2+ VIEW COMPARISON:  Chest radiograph dated 06/10/2021 and chest CT dated 06/21/2020. FINDINGS: Evaluation is limited as the axial view is not provided. There is a fracture of the humeral head which appears chronic. No definite acute fracture. There is no dislocation. There is moderate arthritic changes of the left shoulder. Minimally displaced fracture of the lateral left fourth rib. The soft tissues are unremarkable. IMPRESSION: 1. Chronic appearing fracture of the humeral head. 2. Minimally displaced fracture of the lateral left fourth rib. Electronically Signed   By: Elgie Collard M.D.   On: 07/15/2021 20:26    Procedures Procedures   Medications Ordered in ED Medications  sodium chloride 0.9 % bolus 1,000 mL (1,000 mLs Intravenous Patient Refused/Not Given 07/15/21 1930)  sodium chloride 0.9 % bolus 500 mL (has no administration in time range)  sodium chloride 0.9 % bolus 500 mL (0 mLs Intravenous Stopped 07/15/21 2050)  fentaNYL (SUBLIMAZE) injection 12.5 mcg (12.5 mcg Intravenous Given 07/15/21 2017)  sodium chloride 0.9 % bolus 500 mL (0 mLs Intravenous Stopped 07/15/21 2353)    ED Course  I have reviewed the triage vital signs and the nursing notes.  Pertinent labs & imaging results that were available during my care of the patient were  reviewed by me and considered in my medical decision making (see chart for details).    MDM Rules/Calculators/A&P Patient is a 80 year old female with a past medical history of diabetes, kidney disease, peripheral neuropathy and Alzheimer's dementia who presented from her living facility after an unwitnessed fall.    Patient's daughter arrived to the emergency department and was able to supply more history.  She reports that she received a phone call from her mother's living facility from her father who was visiting.  He told her that her mother had fallen down but he was unsure how or why.  Daughter then called the facility who called EMS to help the patient off the ground.  Unknown how long she was on the ground.  The facility's concern was her inability to raise her utilize her left arm after the fall.  Additionally, they believe that she has been weaker than usual since being diagnosed with COVID last week on 12/13.  Daughter reported that her mother was recovering from COVID however yesterday she began to decline again.  She was complaining of pain all over, worse with motion.  Daughter reports that she was barely able to rise up from seated position on  her own, which is not her baseline.  She is mentating near her baseline however her daughter believes that she is "a little more out of it since COVID."  Patient complained of some pain in her hip that she says she always has, however daughter reports that she never complains of this.  She also reports that the patient's Celexa for neuropathy was decreased earlier this week.  Additionally, a couple of weeks ago the patient was weaned down from "some anticholinergic medications."  Since then the patient has had difficulties with her vision and is following with ophthalmology.  Daughter reports that her pupils are very pinpoint and slow to dilate.   Results: -Patient tested COVID positive as expected -Imaging negative for acute abnormalities, did  reveal chronic fractures.  Patient's daughter reports chronic right rib and left shoulder fractures.  Left shoulder x-ray did reveal a minimally displaced fracture of the lateral fourth rib.  Family was notified and MD Schlosshman and patient's daughter decided not to further investigate the left rib fracture that was not noted on the CT scan. -CT scanning also notes potential inflammation of the bladder.  We will correlate with urinalysis when patient urinates -Lab work with a leukocytosis to 13.9.  Patient's daughter reports that she has a consistently elevated white blood cell count due to "stomach problem."  Hemoglobin 9.2, down from 11 2 months ago.  Could potentially be due to kidney disease worsening. -Nursing staff unable to get urine sample with in and out catheter.  Patient was given 500 cc bolus, daughter requested no more fluids due to her kidney function and states that she needs to be fluid restricted.   At this time, I believe patient's fall and weakness to be due to result of COVID-19 and dehydration.  Dehydration also may be the driving force behind her worsening kidney function.  Daughter permitted for more fluids to be given, additional fluid bolus ordered at this time.  Will discharge according to urine sample.  I believe the patient is stable to be discharged back to her facility as long as her vital signs remained stable.  Any urinary tract infection can likely be treated outpatient.  Final Clinical Impression(s) / ED Diagnoses Final diagnoses:  Fall, initial encounter  COVID-19    Rx / DC Orders Patient signed out to MD Cardama, see his note for ultimate dispo   This chart was dictated using voice recognition software.  Despite best efforts to proofread,  errors can occur which can change the documentation meaning.    Saddie Benders, PA-C 07/16/21 0009    Alvira Monday, MD 07/19/21 1659

## 2021-07-15 NOTE — ED Triage Notes (Signed)
Per ems pt from [facility]  fell this pm  want pt checked out pt does not complaint of any pain,  was trying to stand w walker and slid down

## 2021-07-15 NOTE — ED Notes (Signed)
Pt's daughter requested pt's urine to be collected via purewick or bedpan 1st, not I&O d/t risk of UTI. Provider aware. Also, requested of IV fluids instead of 1L bolus.

## 2021-07-16 DIAGNOSIS — S0990XA Unspecified injury of head, initial encounter: Secondary | ICD-10-CM | POA: Diagnosis not present

## 2021-07-16 LAB — URINALYSIS, MICROSCOPIC (REFLEX)

## 2021-07-16 LAB — URINALYSIS, ROUTINE W REFLEX MICROSCOPIC
Bilirubin Urine: NEGATIVE
Glucose, UA: NEGATIVE mg/dL
Ketones, ur: NEGATIVE mg/dL
Nitrite: NEGATIVE
Protein, ur: NEGATIVE mg/dL
Specific Gravity, Urine: 1.01 (ref 1.005–1.030)
pH: 5.5 (ref 5.0–8.0)

## 2021-07-16 MED ORDER — TRAMADOL HCL 50 MG PO TABS
50.0000 mg | ORAL_TABLET | Freq: Once | ORAL | Status: AC
  Administered 2021-07-16: 01:00:00 50 mg via ORAL
  Filled 2021-07-16: qty 1

## 2021-07-16 MED ORDER — CEPHALEXIN 250 MG PO CAPS
500.0000 mg | ORAL_CAPSULE | Freq: Once | ORAL | Status: AC
  Administered 2021-07-16: 02:00:00 500 mg via ORAL
  Filled 2021-07-16: qty 2

## 2021-07-16 MED ORDER — CEFUROXIME AXETIL 250 MG PO TABS: 250.0000 mg | ORAL_TABLET | Freq: Two times a day (BID) | ORAL | 0 refills | Status: AC

## 2021-07-16 NOTE — ED Provider Notes (Signed)
UA with likely infection Treating with oral abx. Patient and family updated. HDS  The patient appears reasonably screened and/or stabilized for discharge and I doubt any other medical condition or other [facility] requiring further screening, evaluation, or treatment in the ED at this time prior to discharge. Safe for discharge with strict return precautions.  Disposition: Discharge  Condition: Good  I have discussed the results, Dx and Tx plan with the patient/family who expressed understanding and agree(s) with the plan. Discharge instructions discussed at length. The patient/family was given strict return precautions who verbalized understanding of the instructions. No further questions at time of discharge.    ED Discharge Orders          Ordered    cefUROXime (CEFTIN) 250 MG tablet  2 times daily with meals        07/16/21 0225            Follow Up: Andreas Blower., MD [address] [place] [address]  Schedule an appointment as soon as possible for a visit        Eudelia Bunch Amadeo Garnet, MD 07/16/21 [phone]

## 2021-07-18 LAB — URINE CULTURE: Culture: >=100000 — AB

## 2022-01-20 DEATH — deceased

## 2023-05-05 IMAGING — CT CT L SPINE W/O CM
3 of 4 series · 12 of 33 positions shown, 14 images · non-contrast
Comparison: CTA abdomen pelvis 01/18/2021

CLINICAL DATA: Fall

EXAM:
CT THORACIC AND LUMBAR SPINE WITHOUT CONTRAST
TECHNIQUE: Multidetector CT imaging of the thoracic and lumbar spine was
performed without contrast. Multiplanar CT image reconstructions
were also generated.

[Series 6: coronal bone · coronal · 0.41mm/px · 3 of 79 slices shown]
[im 16/79  bone]
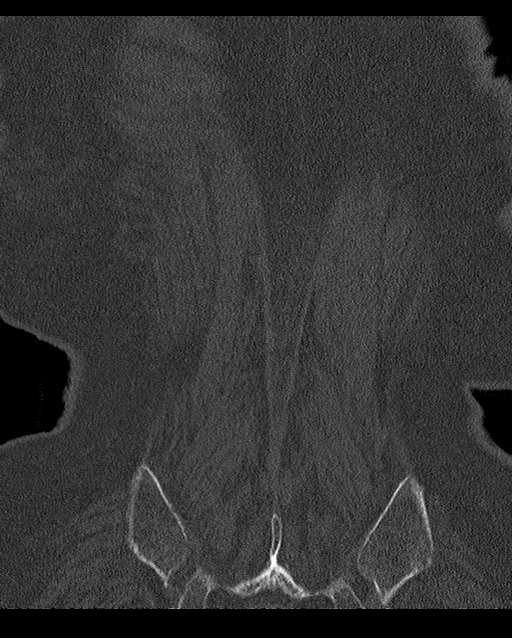
[im 32/79  bone]
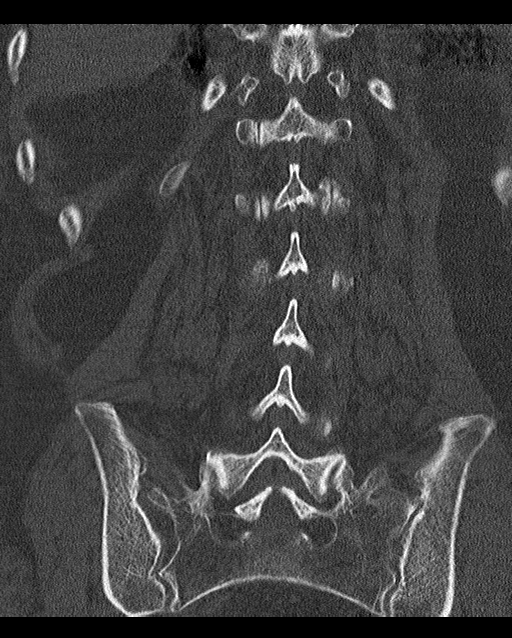
[im 47/79  bone]
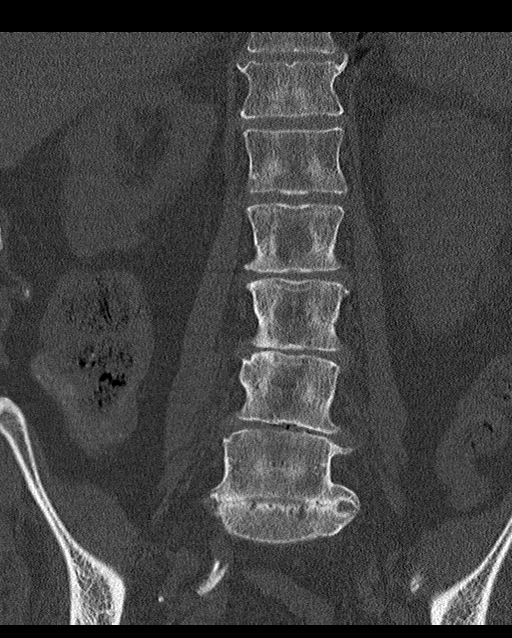

[Series 8: sagittal soft · sagittal · 0.34mm/px · 5 of 82 slices shown, 6 images]
[im 28/82  bone]
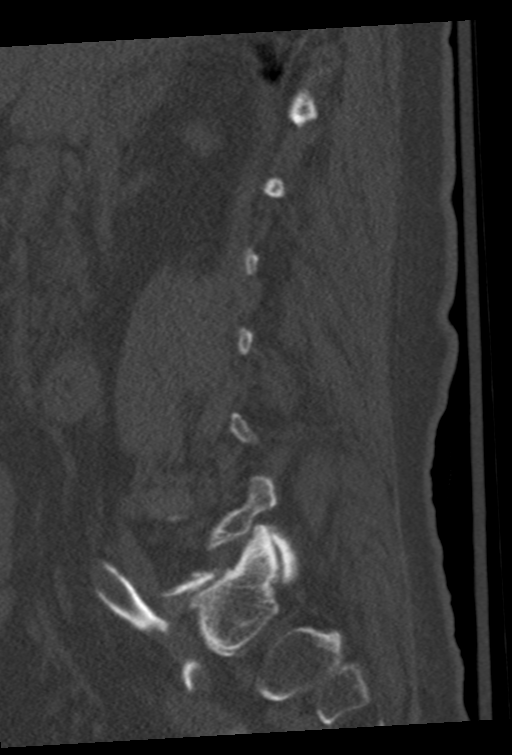
[im 34/82  bone]
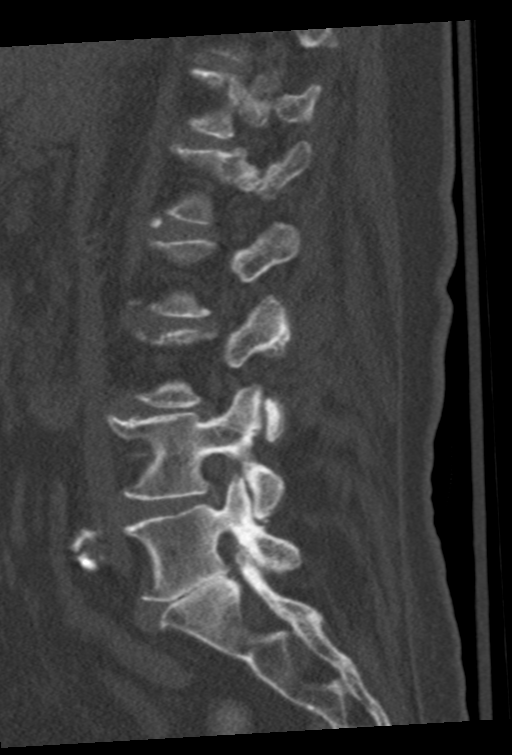
[im 41/82  soft-tissue]
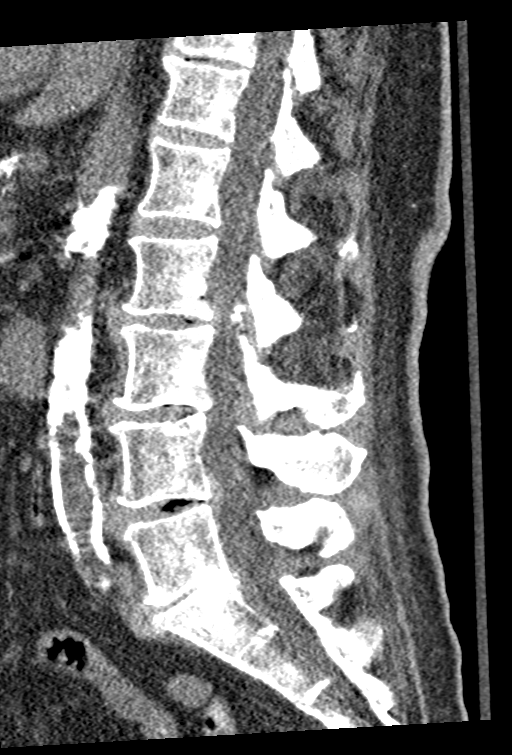
[im 41/82  bone]
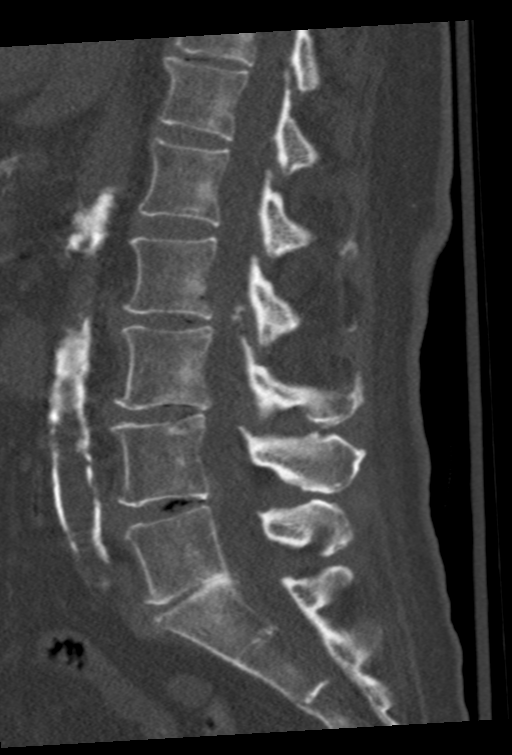
[im 48/82  bone]
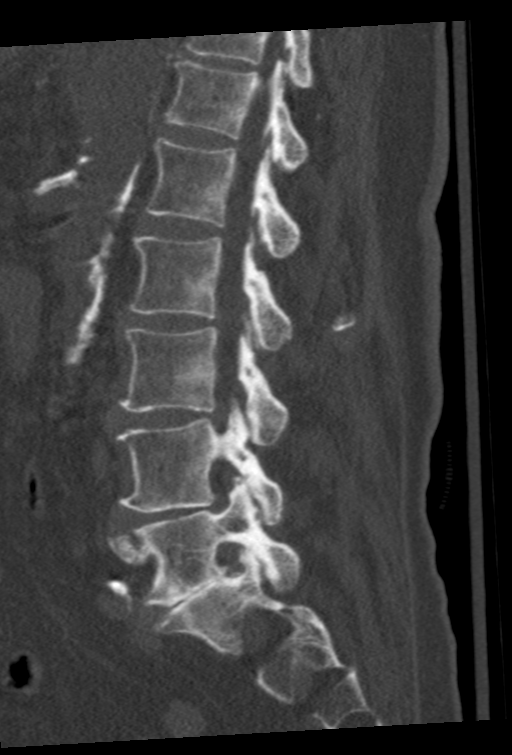
[im 55/82  bone]
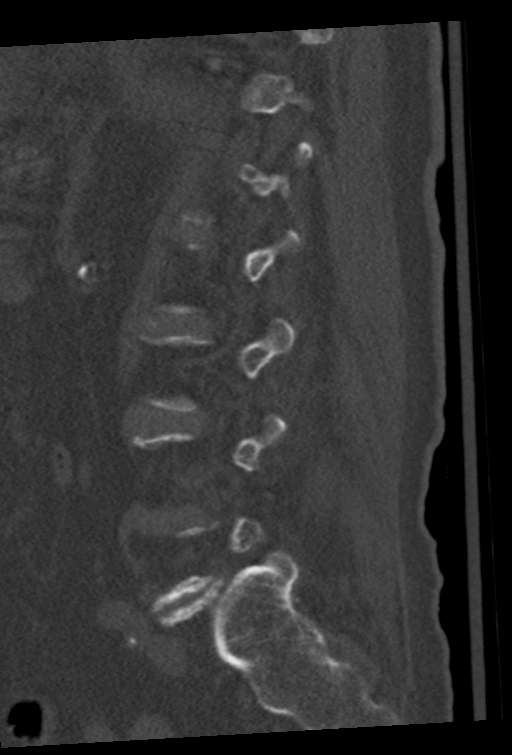

[Series 9: orthogonal axials bone · axial · 0.29mm/px · z∈[-442,-278]mm · 4 of 125 slices shown, 5 images]
[im 21/125  soft-tissue]
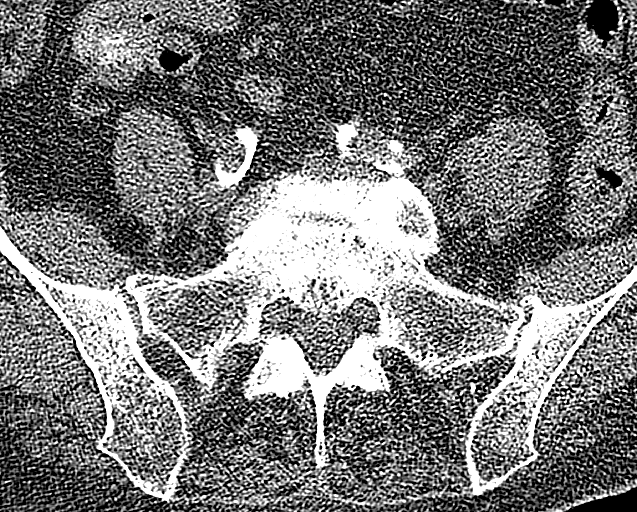
[im 21/125  bone]
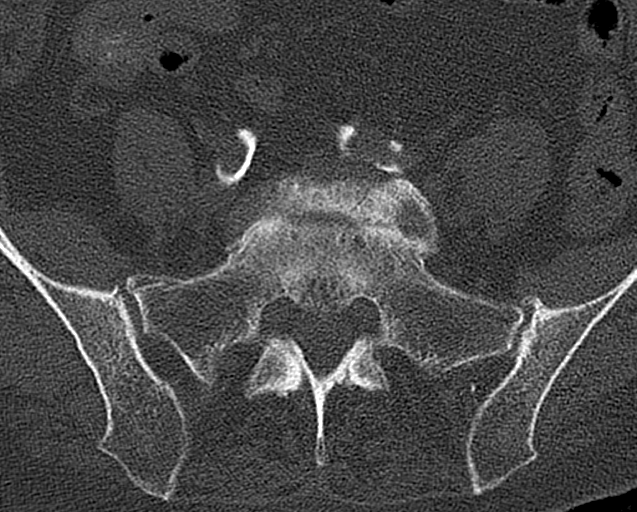
[im 42/125  bone]
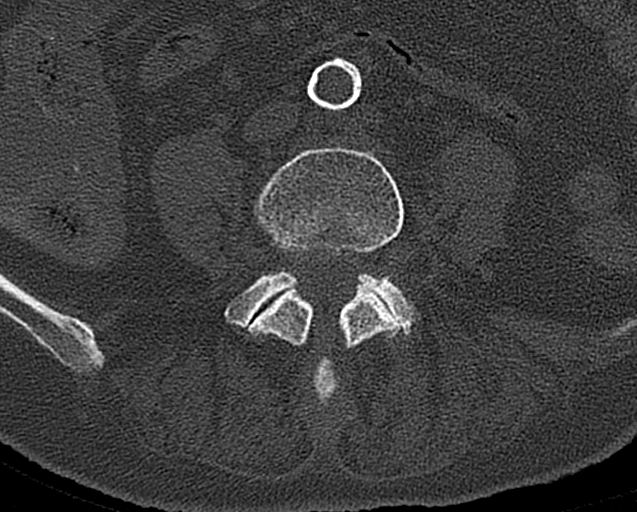
[im 83/125  bone]
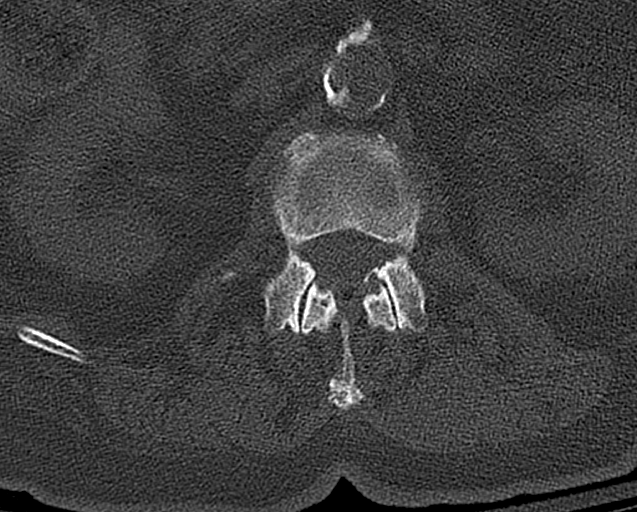
[im 104/125  bone]
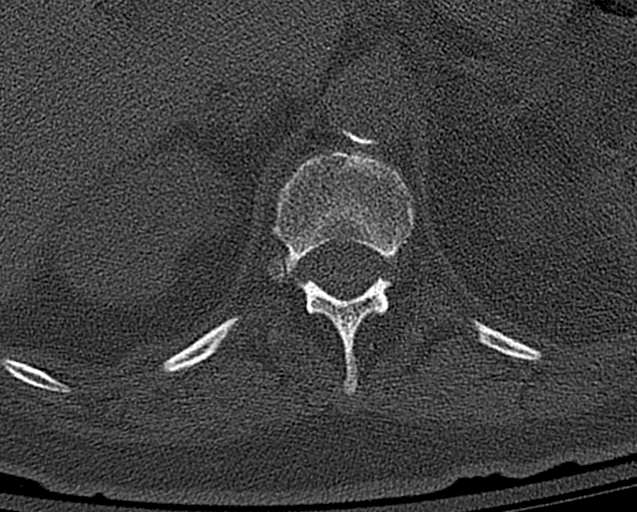

[12 of 33 positions shown; findings below may reference images not displayed]

FINDINGS: CT THORACIC SPINE FINDINGS

Alignment: Normal.

Vertebrae: No acute fracture or focal pathologic process.

Paraspinal and other soft tissues: There is calcific aortic
atherosclerosis and incidentally noted aberrant right subclavian
artery. There are fractures of the right third, fourth and seventh
ribs. These are favored to be nonacute.

Disc levels: There is no spinal canal stenosis.

CT LUMBAR SPINE FINDINGS

Segmentation: 5 lumbar type vertebrae.

Alignment: Normal.

Vertebrae: No acute fracture or focal pathologic process.

Paraspinal and other soft tissues: Calcific aortic atherosclerosis.

Disc levels: No spinal canal stenosis.
IMPRESSION: 1. No acute fracture or static subluxation of the thoracic or lumbar
spine.
2. Fractures of the right third, fourth and seventh ribs are favored
to be nonacute.

Aortic Atherosclerosis (OMNGO-Q42.2).
